# Patient Record
Sex: Male | Born: 2000 | Race: Black or African American | Hispanic: No | Marital: Single | Smoking: Former smoker
Health system: Southern US, Community
[De-identification: ages and names within clinical notes are randomized; demographics above are authoritative.]

## PROBLEM LIST (undated history)

## (undated) DIAGNOSIS — J302 Other seasonal allergic rhinitis: Secondary | ICD-10-CM

---
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
## 2020-02-25 ENCOUNTER — Encounter (HOSPITAL_BASED_OUTPATIENT_CLINIC_OR_DEPARTMENT_OTHER): Payer: Self-pay | Admitting: Emergency Medicine

## 2020-02-25 ENCOUNTER — Emergency Department (HOSPITAL_BASED_OUTPATIENT_CLINIC_OR_DEPARTMENT_OTHER): Payer: BLUE CROSS/BLUE SHIELD

## 2020-02-25 ENCOUNTER — Other Ambulatory Visit: Payer: Self-pay

## 2020-02-25 ENCOUNTER — Emergency Department (HOSPITAL_BASED_OUTPATIENT_CLINIC_OR_DEPARTMENT_OTHER)
Admission: EM | Admit: 2020-02-25 | Discharge: 2020-02-25 | Disposition: A | Discharge status: Home / Self Care | Payer: BLUE CROSS/BLUE SHIELD | Attending: Emergency Medicine | Admitting: Emergency Medicine

## 2020-02-25 DIAGNOSIS — U071 COVID-19: Secondary | ICD-10-CM | POA: Diagnosis not present

## 2020-02-25 DIAGNOSIS — Z87891 Personal history of nicotine dependence: Secondary | ICD-10-CM | POA: Diagnosis not present

## 2020-02-25 DIAGNOSIS — Z20822 Contact with and (suspected) exposure to covid-19: Secondary | ICD-10-CM

## 2020-02-25 DIAGNOSIS — B349 Viral infection, unspecified: Secondary | ICD-10-CM

## 2020-02-25 DIAGNOSIS — R519 Headache, unspecified: Secondary | ICD-10-CM | POA: Diagnosis present

## 2020-02-25 LAB — SARS CORONAVIRUS 2 (TAT 6-24 HRS): SARS Coronavirus 2: POSITIVE — AB

## 2020-02-25 MED ORDER — ACETAMINOPHEN 500 MG PO TABS
1000.0000 mg | ORAL_TABLET | Freq: Once | ORAL | Status: AC
  Administered 2020-02-25: 1000 mg via ORAL
  Filled 2020-02-25: qty 2

## 2020-02-25 MED ORDER — ONDANSETRON 4 MG PO TBDP: ORAL_TABLET | ORAL | 0 refills | Status: AC

## 2020-02-25 MED ORDER — ONDANSETRON 4 MG PO TBDP
8.0000 mg | ORAL_TABLET | Freq: Once | ORAL | Status: AC
  Administered 2020-02-25: 8 mg via ORAL
  Filled 2020-02-25: qty 2

## 2020-02-25 NOTE — ED Provider Notes (Signed)
[facility] EMERGENCY DEPARTMENT Provider Note   CSN: [id number] Arrival date & time: 02/25/20  0109     History Chief Complaint  Patient presents with  . covid symptoms    Darren Doyle is a 20 y.o. male.  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  Patient states that started with headache on Sunday.  Patient noted fever on Monday.  Went to get tested for COVID got a rapid test which was negative.  This was less than 24 hours after onset of symptoms.  The patient continued to feel worse throughout the week to the point where he had decreased appetite but still been able to take in fluids.  States he did work out pretty hard on Tuesday and wonders if some of his myalgias might be from that.  Normal urination normal defecation.  No sick contacts.  No history of the same.  Last took ibuprofen about 4 hours prior to arrival    Past Medical History:  Diagnosis Date  . Seasonal allergies     There are no problems to display for this patient.  History reviewed. No pertinent surgical history.   No family history on file.  Social History   Tobacco Use  . Smoking status: Former Games developer  . Smokeless tobacco: Never Used  Substance Use Topics  . Alcohol use: No  . Drug use: No    Home Medications Prior to Admission medications   Medication Sig Start Date End Date Taking? Authorizing Provider  ondansetron (ZOFRAN ODT) 4 MG disintegrating tablet 4mg  ODT q4 hours prn nausea/vomit 02/25/20  Yes Deandrew Hoecker, 04/24/20, MD  acetaminophen-codeine (TYLENOL #3) 300-30 MG per tablet Take 1 tablet by mouth every 6 (six) hours as needed for severe pain. 11/27/12   Piepenbrink, 11/29/12, PA-C  cetirizine (ZYRTEC) 10 MG tablet Take 10 mg by mouth at bedtime.     [provider]  ibuprofen (ADVIL,MOTRIN) 600 MG tablet Take 1 tablet (600 mg total) by mouth every 6 (six) hours as  needed for mild pain or moderate pain. 11/27/12   Piepenbrink, 11/29/12, PA-C    Allergies    Patient has no known allergies.  Review of Systems   Review of Systems  All other systems reviewed and are negative.   Physical Exam Updated Vital Signs BP 111/62   Pulse 87   Temp 99.1 F (37.3 C) (Oral)   Resp 16   Ht 6' (1.829 m)   Wt 82.1 kg   SpO2 97%   BMI 24.55 kg/m   Physical Exam Vitals and nursing note reviewed.  Constitutional:      Appearance: He is well-developed and well-nourished.  HENT:     Head: Normocephalic and atraumatic.     Nose: Nose normal. No congestion or rhinorrhea.     Mouth/Throat:     Mouth: Mucous membranes are moist.     Pharynx: Oropharynx is clear.  Eyes:     Pupils: Pupils are equal, round, and reactive to light.  Cardiovascular:     Rate and Rhythm: Normal rate.  Pulmonary:     Effort: Pulmonary effort is normal. No respiratory distress.  Abdominal:     General: Abdomen is flat. There is no distension.  Musculoskeletal:        General: Normal range of motion.     Cervical back: Normal range of motion.  Skin:    General: Skin is warm and dry.  Coloration: Skin is not jaundiced or pale.  Neurological:     General: No focal deficit present.     Mental Status: He is alert.     ED Results / Procedures / Treatments   Labs (all labs ordered are listed, but only abnormal results are displayed) Labs Reviewed  SARS CORONAVIRUS 2 (TAT 6-24 HRS)    EKG None  Radiology DG Chest Portable 1 View  Result Date: 02/25/2020 CLINICAL DATA:  Chills, fever EXAM: PORTABLE CHEST 1 VIEW COMPARISON:  None. FINDINGS: The heart size and mediastinal contours are within normal limits. Both lungs are clear. The visualized skeletal structures are unremarkable. IMPRESSION: Negative. Electronically Signed   By: Charlett Nose M.D.   On: 02/25/2020 02:13    Procedures Procedures   Medications Ordered in ED Medications  ondansetron (ZOFRAN-ODT)  disintegrating tablet 8 mg (8 mg Oral Given 02/25/20 0143)  acetaminophen (TYLENOL) tablet 1,000 mg (1,000 mg Oral Given 02/25/20 0143)    ED Course  I have reviewed the triage vital signs and the nursing notes.  Pertinent labs & imaging results that were available during my care of the patient were reviewed by me and considered in my medical decision making (see chart for details).    MDM Rules/Calculators/A&P                         Tachy and dry cough. Will check 24 hour covid. Will check cxr. Supportive care otherwise.  xr ok. No indication for abx. Hr improved with fever, doubt myocarditis, endocarditis or other severe bacterial infection.   Final Clinical Impression(s) / ED Diagnoses Final diagnoses:  Viral illness  Encounter for laboratory testing for COVID-19 virus    Rx / DC Orders ED Discharge Orders         Ordered    ondansetron (ZOFRAN ODT) 4 MG disintegrating tablet        02/25/20 0310           Geraldean Walen, Barbara Cower, MD 02/25/20 [phone]

## 2020-02-25 NOTE — ED Triage Notes (Signed)
Reports chills, headaches, muscle aches. Fever, tmax 105. Not taking any meds for fever. Tested for covid at [facility] yesterday - negative.

## 2022-07-03 IMAGING — DX DG CHEST 1V PORT
1 series · 1 of 1 positions shown · non-contrast
Comparison: None.

CLINICAL DATA: Chills, fever

EXAM:
PORTABLE CHEST 1 VIEW

[chest ap]
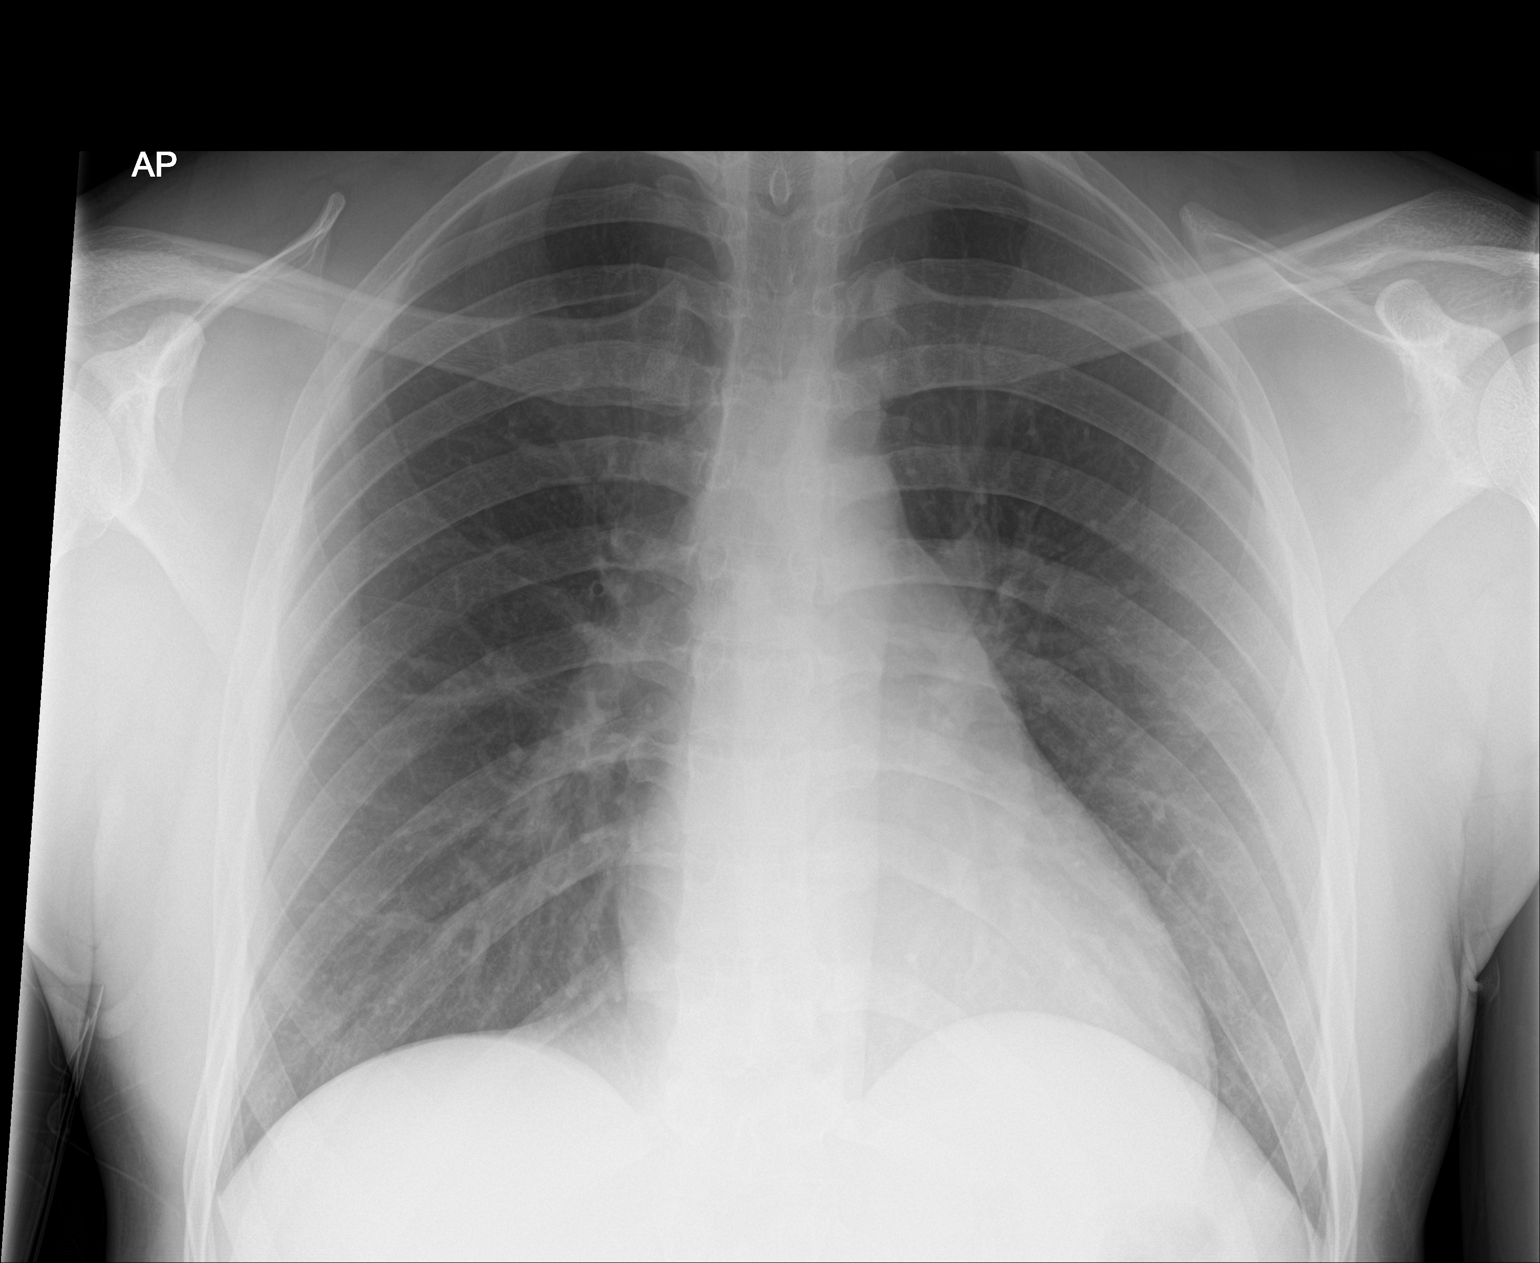

[1 of 1 positions shown; findings below may reference images not displayed]

FINDINGS: The heart size and mediastinal contours are within normal limits.
Both lungs are clear. The visualized skeletal structures are
unremarkable.
IMPRESSION: Negative.

## 2023-12-20 ENCOUNTER — Ambulatory Visit (HOSPITAL_COMMUNITY)
Admission: EM | Admit: 2023-12-20 | Discharge: 2023-12-20 | Disposition: A | Discharge status: Home / Self Care | Payer: Self-pay

## 2023-12-20 ENCOUNTER — Encounter (HOSPITAL_COMMUNITY): Payer: Self-pay | Admitting: Emergency Medicine

## 2023-12-20 DIAGNOSIS — M62830 Muscle spasm of back: Secondary | ICD-10-CM

## 2023-12-20 DIAGNOSIS — S46811A Strain of other muscles, fascia and tendons at shoulder and upper arm level, right arm, initial encounter: Secondary | ICD-10-CM

## 2023-12-20 MED ORDER — METHOCARBAMOL 750 MG PO TABS: 750.0000 mg | ORAL_TABLET | Freq: Four times a day (QID) | ORAL | 0 refills | Status: AC | PRN

## 2023-12-20 NOTE — Discharge Instructions (Signed)
  Diagnosis: You are experiencing muscle spasms in the muscles along your lower back (lumbar paraspinals) and the right trapezius (upper back/shoulder area). Muscle spasms are involuntary contractions that can cause pain, tightness, and limited movement. Treatment Plan: Ibuprofen  600 mg every 6 hours: Take with food to reduce stomach upset. Do not exceed the recommended dose. Helps reduce pain and inflammation. Methocarbamol  750 mg four times daily: Take as prescribed for muscle relaxation. May cause drowsiness--use caution with driving or operating machinery. Heat Therapy: Apply a heating pad or warm compress to the affected areas for 15-20 minutes at a time, several times a day. Heat helps relax muscles and improve blood flow. Activity as Tolerated: Continue gentle movement and daily activities as much as possible, but avoid activities that worsen your pain. Prolonged bed rest is not recommended, as gentle activity can help speed recovery. Additional Tips: Posture: Maintain good posture when sitting or standing to reduce strain on your back and neck. Stretching: Gentle stretching may help once acute pain improves. Hydration: Stay well-hydrated, as dehydration can contribute to muscle cramps. When to Seek Further Evaluation: Severe or worsening pain Numbness, tingling, or weakness in your arms or legs Loss of bladder or bowel control No improvement after 1-2 weeks of treatment
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
## 2023-12-20 NOTE — ED Provider Notes (Signed)
 [facility]    CSN: [id number] Arrival date & time: 12/20/23  1017      History   Chief Complaint Chief Complaint  Patient presents with   Back Pain    HPI Darren Doyle is a 23 y.o. male.   Darren Doyle is a 23 year old male with past medical history of back issue.  He reports that his back issues have been muscular in nature with no spinal issues.  He reports that every now and again he will experience low back pain and tightness.  He reports that he was doing [facility] with [employer] today when he had lifted 350 pounds.  After this he felt tightness, aching, and throbbing of the right trapezius muscle.  The pain radiates into his right upper arm.  After dead lifting he did some push-ups and reports that he felt some tingling in the hand that abated after rest.  He reports that he has full shoulder range of motion and denies weakness.  He reports that trapezius muscle pain and low back pain is 6 out of 10 and is aching and tight in nature.  He denies numbness tingling weakness of the extremities,  Bladder bowel incontinence, or paresthesias around his buttocks or genitals.  He has not take anything over-the-counter for his pain today  The history is provided by the patient.  Back Pain Associated symptoms: no headaches, no numbness and no weakness     Past Medical History:  Diagnosis Date   Seasonal allergies     There are no active problems to display for this patient.   History reviewed. No pertinent surgical history.     Home Medications    Prior to Admission medications   Medication Sig Start Date End Date Taking? Authorizing Provider  methocarbamol  (ROBAXIN ) 750 MG tablet Take 1 tablet (750 mg total) by mouth 4 (four) times daily as needed for muscle spasms. 12/20/23  Yes Leatrice Vernell HERO, NP  acetaminophen -codeine  (TYLENOL  #3) 300-30 MG per tablet Take 1 tablet by mouth every 6 (six) hours as needed for severe pain. 11/27/12   Piepenbrink, Delon, PA-C   cetirizine (ZYRTEC) 10 MG tablet Take 10 mg by mouth at bedtime.     [provider]  ibuprofen  (ADVIL ,MOTRIN ) 600 MG tablet Take 1 tablet (600 mg total) by mouth every 6 (six) hours as needed for mild pain or moderate pain. 11/27/12   Piepenbrink, Delon, PA-C  ondansetron  (ZOFRAN  ODT) 4 MG disintegrating tablet 4mg  ODT q4 hours prn nausea/vomit 02/25/20   Mesner, Selinda, MD    Family History History reviewed. No pertinent family history.  Social History Social History   Tobacco Use   Smoking status: Former   Smokeless tobacco: Never  [employer]   Vaping status: Never Used  Substance Use Topics   Alcohol use: No   Drug use: No     Allergies   Patient has no known allergies.   Review of Systems Review of Systems  Constitutional: Negative.   Musculoskeletal:  Positive for back pain and myalgias. Negative for arthralgias, gait problem, joint swelling, neck pain and neck stiffness.  Neurological:  Negative for tremors, weakness, numbness and headaches.     Physical Exam Triage Vital Signs ED Triage Vitals  Encounter Vitals Group     BP 12/20/23 1048 123/68     Girls Systolic BP Percentile --      Girls Diastolic BP Percentile --      Boys Systolic BP Percentile --  Boys Diastolic BP Percentile --      Pulse Rate 12/20/23 1048 91     Resp 12/20/23 1048 16     Temp 12/20/23 1048 97.9 F (36.6 C)     Temp Source 12/20/23 1048 Oral     SpO2 12/20/23 1048 95 %     Weight --      Height --      Head Circumference --      Peak Flow --      Pain Score 12/20/23 1047 6     Pain Loc --      Pain Education --      Exclude from Growth Chart --    No data found.  Updated Vital Signs BP 123/68 (BP Location: Left Arm)   Pulse 91   Temp 97.9 F (36.6 C) (Oral)   Resp 16   SpO2 95%   Visual Acuity Right Eye Distance:   Left Eye Distance:   Bilateral Distance:    Right Eye Near:   Left Eye Near:    Bilateral Near:     Physical Exam Vitals and  nursing note reviewed.  Constitutional:      General: He is not in acute distress.    Appearance: Normal appearance. He is normal weight. He is not toxic-appearing.  Eyes:     Conjunctiva/sclera: Conjunctivae normal.  Cardiovascular:     Rate and Rhythm: Normal rate and regular rhythm.     Heart sounds: Normal heart sounds.  Pulmonary:     Effort: Pulmonary effort is normal.     Breath sounds: Normal breath sounds and air entry.  Musculoskeletal:     Right shoulder: Tenderness (Tenderness and muscle spasm noted on right trapezius) present. No swelling or deformity. Normal range of motion.     Left shoulder: Normal.     Right upper arm: Tenderness (Mild tenderness of right deltoid) present. No swelling, edema, deformity, lacerations or bony tenderness.     Cervical back: Normal.     Thoracic back: Normal.     Lumbar back: Spasms and tenderness present. No swelling or bony tenderness. Normal range of motion. Negative right straight leg raise test and negative left straight leg raise test.     Comments: Spasm and tenderness noted to bilateral lumbar paraspinal muscles.   Lymphadenopathy:     Cervical:     Right cervical: No posterior cervical adenopathy.    Left cervical: No posterior cervical adenopathy.  Skin:    General: Skin is warm and dry.  Neurological:     Mental Status: He is alert and oriented to person, place, and time.     Motor: No weakness.  Psychiatric:        Mood and Affect: Mood normal.        Behavior: Behavior normal.      UC Treatments / Results  Labs (all labs ordered are listed, but only abnormal results are displayed) Labs Reviewed - No data to display  EKG   Radiology No results found.  Procedures Procedures (including critical care time)  Medications Ordered in UC Medications - No data to display  Initial Impression / Assessment and Plan / UC Course  I have reviewed the triage vital signs and the nursing notes.  Pertinent labs & imaging  results that were available during my care of the patient were reviewed by me and considered in my medical decision making (see chart for details).     Tenderness and spasm noted to palpation of  right trapezius muscles as well as bilateral lower paraspinal muscles.  Straight leg test negative and range of motion and strength intact for right upper extremity and bilateral lower extremities.  No bony tenderness noted.  Low concern for any spinal involvement.  Final diagnosis trapezius muscle sprain and muscle spasm of lumbar spine.  Azaiah will take ibuprofen  600 mg every 6 hours as needed pain, apply heat to the area, and take methocarbamol  750 mg 4 times a day as needed for muscle spasms and tightness.  He will seek medical care if he experiences any any numbness, tingling, or weakness of the extremities, bladder or bowel incontinence, or severe pain.  He will modify activity as needed to facilitate healing and prevent reinjury Final Clinical Impressions(s) / UC Diagnoses   Final diagnoses:  Strain of right trapezius muscle, initial encounter  Muscle spasm of back     Discharge Instructions       Diagnosis: You are experiencing muscle spasms in the muscles along your lower back (lumbar paraspinals) and the right trapezius (upper back/shoulder area). Muscle spasms are involuntary contractions that can cause pain, tightness, and limited movement. Treatment Plan: Ibuprofen  600 mg every 6 hours: Take with food to reduce stomach upset. Do not exceed the recommended dose. Helps reduce pain and inflammation. Methocarbamol  750 mg four times daily: Take as prescribed for muscle relaxation. May cause drowsiness--use caution with driving or operating machinery. Heat Therapy: Apply a heating pad or warm compress to the affected areas for 15-20 minutes at a time, several times a day. Heat helps relax muscles and improve blood flow. Activity as Tolerated: Continue gentle movement and daily  activities as much as possible, but avoid activities that worsen your pain. Prolonged bed rest is not recommended, as gentle activity can help speed recovery. Additional Tips: Posture: Maintain good posture when sitting or standing to reduce strain on your back and neck. Stretching: Gentle stretching may help once acute pain improves. Hydration: Stay well-hydrated, as dehydration can contribute to muscle cramps. When to Seek Further Evaluation: Severe or worsening pain Numbness, tingling, or weakness in your arms or legs Loss of bladder or bowel control No improvement after 1-2 weeks of treatment    ED Prescriptions     Medication Sig Dispense Auth. Provider   methocarbamol  (ROBAXIN ) 750 MG tablet Take 1 tablet (750 mg total) by mouth 4 (four) times daily as needed for muscle spasms. 28 tablet Leatrice Vernell HERO, NP      PDMP not reviewed this encounter.   Leatrice Vernell HERO, NP 12/20/23 [phone]
